# Patient Record
Sex: Male | Born: 1965 | Race: White | Hispanic: No | Marital: Married | Smoking: Never smoker
Health system: Southern US, Community
[De-identification: ages and names within clinical notes are randomized; demographics above are authoritative.]

## PROBLEM LIST (undated history)

## (undated) DIAGNOSIS — R7303 Prediabetes: Secondary | ICD-10-CM

## (undated) DIAGNOSIS — T7840XA Allergy, unspecified, initial encounter: Secondary | ICD-10-CM

## (undated) DIAGNOSIS — E119 Type 2 diabetes mellitus without complications: Secondary | ICD-10-CM

## (undated) DIAGNOSIS — E7800 Pure hypercholesterolemia, unspecified: Secondary | ICD-10-CM

## (undated) DIAGNOSIS — M1990 Unspecified osteoarthritis, unspecified site: Secondary | ICD-10-CM

## (undated) DIAGNOSIS — I10 Essential (primary) hypertension: Secondary | ICD-10-CM

## (undated) HISTORY — DX: Unspecified osteoarthritis, unspecified site: M19.90

## (undated) HISTORY — PX: KNEE ARTHROSCOPY: SUR90

## (undated) HISTORY — PX: ANKLE FRACTURE SURGERY: SHX122

## (undated) HISTORY — DX: Allergy, unspecified, initial encounter: T78.40XA

## (undated) HISTORY — DX: Type 2 diabetes mellitus without complications: E11.9

---

## 2006-04-18 ENCOUNTER — Emergency Department (HOSPITAL_COMMUNITY): Admission: EM | Admit: 2006-04-18 | Discharge: 2006-04-18 | Payer: Self-pay | Admitting: Emergency Medicine

## 2008-02-07 IMAGING — CR DG CHEST 2V
2 series · 2 of 2 positions shown · non-contrast
Comparison: None.

CLINICAL DATA: Chest pain/shortness of breath.  
 CHEST ? 2 VIEW:

[w chest pa]
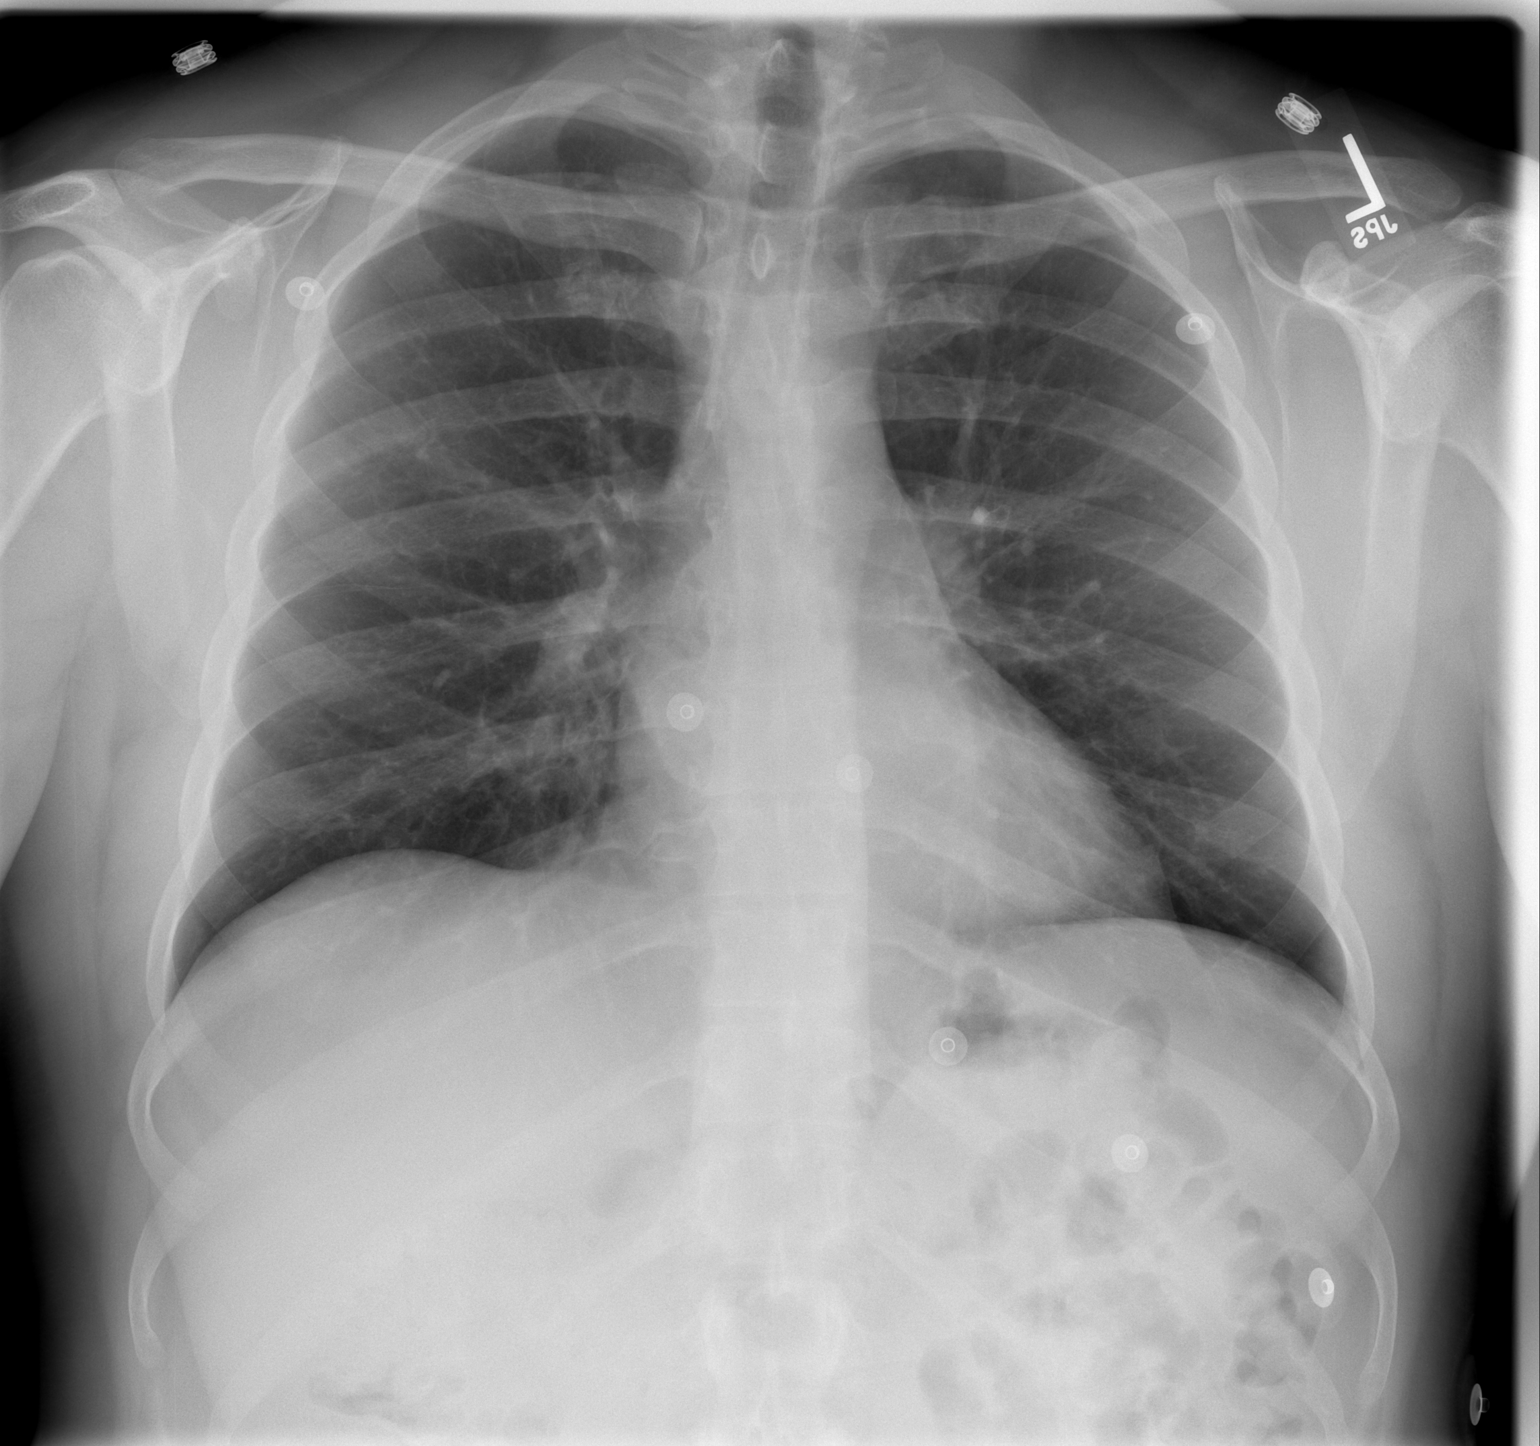

[w chest lat]
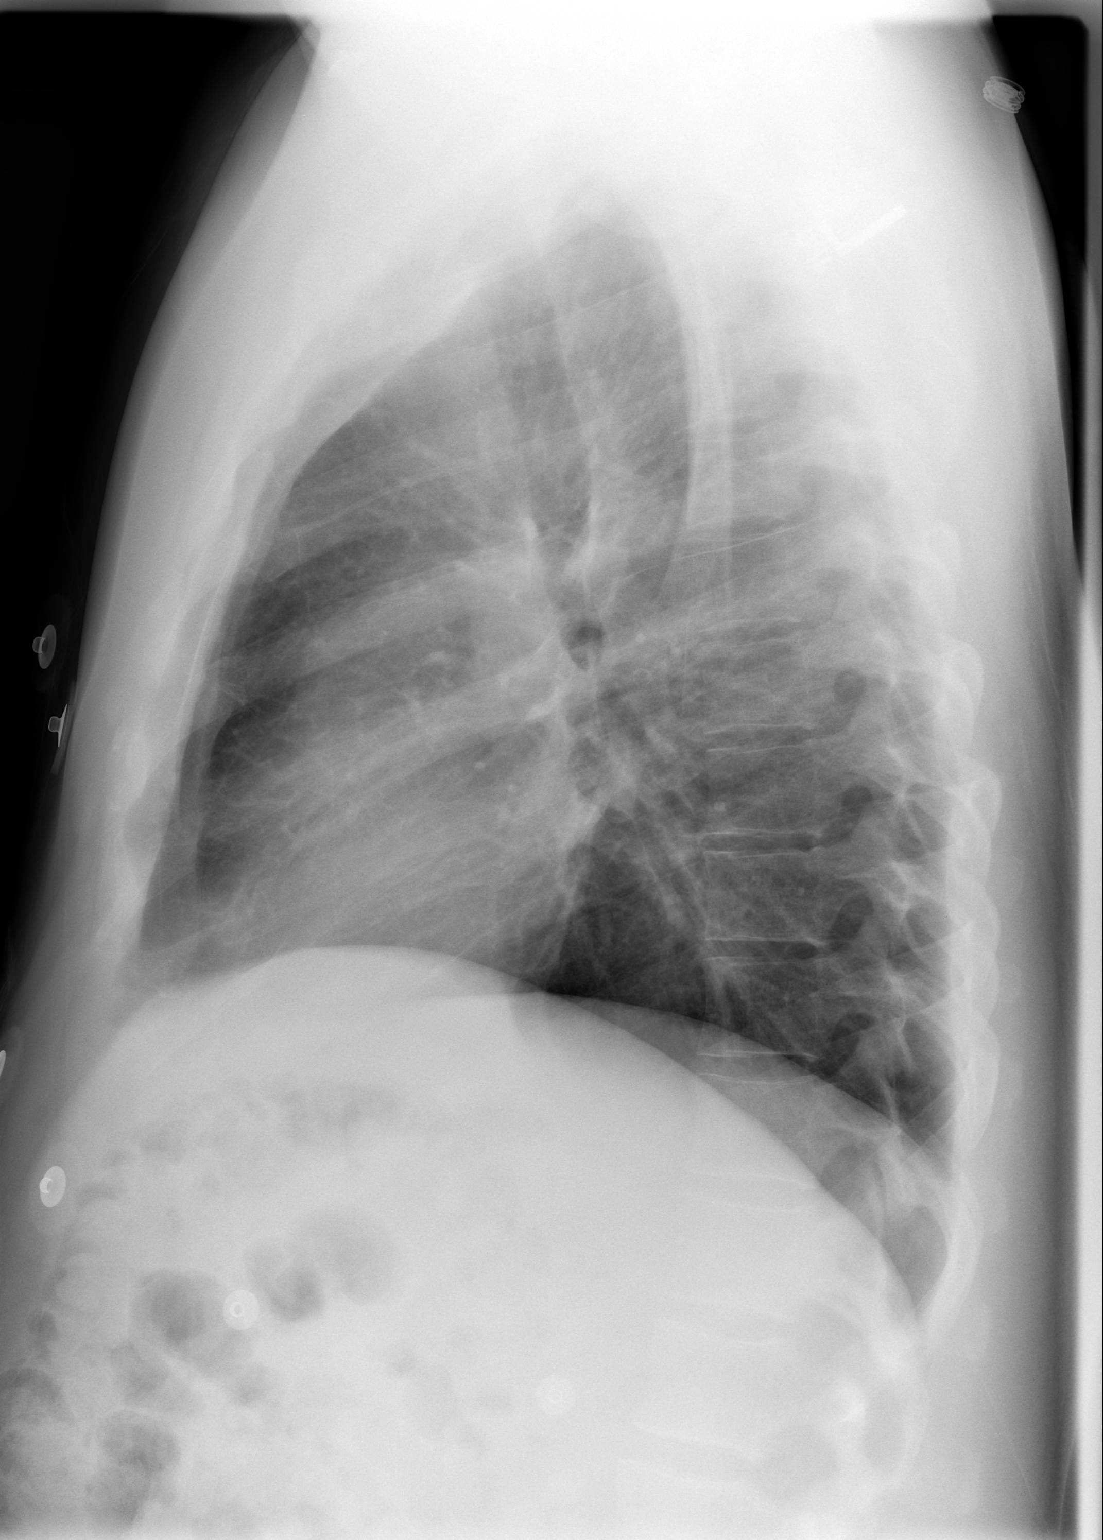

[2 of 2 positions shown; findings below may reference images not displayed]

FINDINGS: Heart and vascularity are normal.  Slight peribronchial thickening.  Lungs are clear.  Osseous structures are intact.
IMPRESSION: No active disease.

## 2008-08-18 ENCOUNTER — Ambulatory Visit: Payer: Self-pay | Admitting: Family Medicine

## 2008-08-18 DIAGNOSIS — B029 Zoster without complications: Secondary | ICD-10-CM | POA: Insufficient documentation

## 2018-12-08 DIAGNOSIS — S86919A Strain of unspecified muscle(s) and tendon(s) at lower leg level, unspecified leg, initial encounter: Secondary | ICD-10-CM | POA: Insufficient documentation

## 2020-03-22 ENCOUNTER — Other Ambulatory Visit: Payer: Self-pay

## 2020-03-22 ENCOUNTER — Ambulatory Visit
Admission: EM | Admit: 2020-03-22 | Discharge: 2020-03-22 | Disposition: A | Discharge status: Home / Self Care | Payer: Self-pay | Attending: Urgent Care | Admitting: Urgent Care

## 2020-03-22 DIAGNOSIS — L02414 Cutaneous abscess of left upper limb: Secondary | ICD-10-CM | POA: Diagnosis not present

## 2020-03-22 DIAGNOSIS — M79632 Pain in left forearm: Secondary | ICD-10-CM | POA: Diagnosis not present

## 2020-03-22 MED ORDER — DOXYCYCLINE HYCLATE 100 MG PO CAPS: 100.0000 mg | ORAL_CAPSULE | Freq: Two times a day (BID) | ORAL | 0 refills | Status: DC

## 2020-03-22 NOTE — Discharge Instructions (Signed)
Please change your dressing 3-5 times daily. Do not apply any ointments or creams. Each time you change your dressing, make sure that you are pressing on the wound to get pus to come out.  Try your best to have a family member help you clean gently around the perimeter of the wound with gentle soap and warm water. Pat your wound dry and let it air out if possible to make sure it is dry before reapplying another dressing.    Do not use any nonsteroidal anti-inflammatories (NSAIDs) like ibuprofen, Motrin, naproxen, Aleve, etc. which are all available over-the-counter.  Please just use Tylenol at a dose of 500mg-650mg once every 6 hours as needed for your aches, pains, fevers.  

## 2020-03-22 NOTE — ED Provider Notes (Signed)
  [facility]   MRN: [id number] DOB: [birth year]  Subjective:   Terry Hoffman is a 55 y.o. male presenting for 1 week history of persistent left forearm pain, swelling, redness.  Patient did have an open wound but has had a difficult time draining anything from it.  Has gotten worse in the past couple days.  Has been using Aleve consistently with very temporary relief.  Has a history of staph infections.  No current facility-administered medications for this encounter. No current outpatient medications on file.   No Known Allergies  History reviewed. No pertinent past medical history.   History reviewed. No pertinent surgical history.  History reviewed. No pertinent family history.  Social History   Tobacco Use  . Smoking status: Never Smoker  . Smokeless tobacco: Never Used  Substance Use Topics  . Alcohol use: Never  . Drug use: Never    ROS   Objective:   Vitals: BP (!) 180/96 (BP Location: Left Arm)   Pulse (!) 109   Temp 98.7 F (37.1 C) (Oral)   Resp 18   SpO2 96%   Physical Exam Constitutional:      General: He is not in acute distress.    Appearance: Normal appearance. He is well-developed and normal weight. He is not ill-appearing, toxic-appearing or diaphoretic.  HENT:     Head: Normocephalic and atraumatic.     Right Ear: External ear normal.     Left Ear: External ear normal.     Nose: Nose normal.     Mouth/Throat:     Pharynx: Oropharynx is clear.  Eyes:     General: No scleral icterus.       Right eye: No discharge.        Left eye: No discharge.     Extraocular Movements: Extraocular movements intact.     Pupils: Pupils are equal, round, and reactive to light.  Cardiovascular:     Rate and Rhythm: Normal rate.  Pulmonary:     Effort: Pulmonary effort is normal.  Musculoskeletal:       Arms:     Cervical back: Normal range of motion.  Neurological:     Mental Status: He is alert and oriented to person, place, and time.   Psychiatric:        Mood and Affect: Mood normal.        Behavior: Behavior normal.        Thought Content: Thought content normal.        Judgment: Judgment normal.     PROCEDURE NOTE: I&D of Abscess Verbal consent obtained. Local anesthesia with 1cc of 2% lidocaine with epinephrine. Site cleansed with Betadine.  Eschar was removed and ~1cc of purulence expressed. Cleansed and dressed.   Assessment and Plan :   PDMP not reviewed this encounter.  1. Pain of left forearm   2. Abscess of forearm, left     Successful I&D performed.  Wound care reviewed.  Start doxycycline for the abscess, Tylenol for pain and his blood pressure, recommended avoiding NSAIDs for now.  Counseled patient on potential for adverse effects with medications prescribed/recommended today, ER and return-to-clinic precautions discussed, patient verbalized understanding.    Wallis Bamberg, PA-C 03/22/20 1318

## 2020-03-22 NOTE — ED Triage Notes (Signed)
Pt c/o abscess to lt lateral forearm x1 wk. States hx of staph infection. Redness with swelling noted, denies drainage.

## 2021-05-01 ENCOUNTER — Ambulatory Visit
Admission: EM | Admit: 2021-05-01 | Discharge: 2021-05-01 | Disposition: A | Discharge status: Home / Self Care | Payer: BC Managed Care – PPO

## 2021-05-01 DIAGNOSIS — L0291 Cutaneous abscess, unspecified: Secondary | ICD-10-CM | POA: Diagnosis not present

## 2021-05-01 MED ORDER — DOXYCYCLINE HYCLATE 100 MG PO CAPS: 100.0000 mg | ORAL_CAPSULE | Freq: Two times a day (BID) | ORAL | 0 refills | Status: DC

## 2021-05-01 NOTE — ED Triage Notes (Signed)
3 day h/o abscess on right axilla. Area has increased in size and redness since the onset. Pt reports he was seen at another UC, who thought the area was an ingrown hair and tx Pt with bactrim. No decrease in sxs since starting the meds. Pt is concerned for staph infection. ?

## 2021-05-01 NOTE — ED Provider Notes (Signed)
?[facility] ? ? ? ?CSN: [id number] ?Arrival date & time: 05/01/21  0935 ? ? ?  ? ?History   ?Chief Complaint ?Chief Complaint  ?Patient presents with  ? Abscess  ?  axilla  ? ? ?HPI ?Terry Hoffman is a 56 y.o. male.  ? ?Patient here today for evaluation of abscess to his right axilla. He first noted irritation to the area 3 days ago. He has since been evaluated and treated with bactrim but erythema and pain seems to be increasing in size. He does report low grade fever yesterday. He has history of staph infections.  ? ?The history is provided by the patient.  ? ?History reviewed. No pertinent past medical history. ? ?Patient Active Problem List  ? Diagnosis Date Noted  ? HERPES ZOSTER WITHOUT MENTION OF COMPLICATION 08/18/2008  ? ? ?History reviewed. No pertinent surgical history. ? ? ? ? ?Home Medications   ? ?Prior to Admission medications   ?Medication Sig Start Date End Date Taking? Authorizing Provider  ?doxycycline (VIBRAMYCIN) 100 MG capsule Take 1 capsule (100 mg total) by mouth 2 (two) times daily. 05/01/21  Yes Tomi Bamberger, PA-C  ?albuterol (VENTOLIN HFA) 108 (90 Base) MCG/ACT inhaler Inhale 2 puffs into the lungs every 6 (six) hours as needed. 01/10/21   [provider]  ?sulfamethoxazole-trimethoprim (BACTRIM DS) 800-160 MG tablet Take 1 tablet by mouth 2 (two) times daily. 04/29/21   [provider]  ? ? ?Family History ?History reviewed. No pertinent family history. ? ?Social History ?Social History  ? ?Tobacco Use  ? Smoking status: Never  ? Smokeless tobacco: Never  ?Substance Use Topics  ? Alcohol use: Never  ? Drug use: Never  ? ? ? ?Allergies   ?Patient has no known allergies. ? ? ?Review of Systems ?Review of Systems  ?Constitutional:  Negative for chills and fever.  ?Eyes:  Negative for discharge and redness.  ?Skin:  Positive for color change. Negative for wound.  ?Neurological:  Negative for numbness.  ? ? ?Physical Exam ?Triage Vital Signs ?ED Triage Vitals   ?Enc Vitals Group  ?   BP   ?   Pulse   ?   Resp   ?   Temp   ?   Temp src   ?   SpO2   ?   Weight   ?   Height   ?   Head Circumference   ?   Peak Flow   ?   Pain Score   ?   Pain Loc   ?   Pain Edu?   ?   Excl. in GC?   ? ?No data found. ? ?Updated Vital Signs ?BP (!) 170/121 (BP Location: Left Arm)   Pulse (!) 108   Temp 98.5 ?F (36.9 ?C) (Oral)   Resp 17   SpO2 97%  ?   ? ?Physical Exam ?Vitals and nursing note reviewed.  ?Constitutional:   ?   General: He is not in acute distress. ?   Appearance: Normal appearance. He is not ill-appearing.  ?HENT:  ?   Head: Normocephalic and atraumatic.  ?Eyes:  ?   Conjunctiva/sclera: Conjunctivae normal.  ?Cardiovascular:  ?   Rate and Rhythm: Normal rate.  ?Pulmonary:  ?   Effort: Pulmonary effort is normal.  ?Skin: ?   Comments: Approx 10 cm x 17 cm area of erythema to right torso below axilla, mild induration noted, no fluctuance noted  ?Neurological:  ?   Mental Status:  He is alert.  ?Psychiatric:     ?   Mood and Affect: Mood normal.     ?   Behavior: Behavior normal.     ?   Thought Content: Thought content normal.  ? ? ? ?UC Treatments / Results  ?Labs ?(all labs ordered are listed, but only abnormal results are displayed) ?Labs Reviewed - No data to display ? ?EKG ? ? ?Radiology ?No results found. ? ?Procedures ?Procedures (including critical care time) ? ?Medications Ordered in UC ?Medications - No data to display ? ?Initial Impression / Assessment and Plan / UC Course  ?I have reviewed the triage vital signs and the nursing notes. ? ?Pertinent labs & imaging results that were available during my care of the patient were reviewed by me and considered in my medical decision making (see chart for details). ? ?  ?Concerning that infection continues to spread despite use of bactrim-- will add doxycycline to oral regimen with very strict precaution to report to ED with any worsening or lack of improvement in 24 hours. I did use permanent marker to outline border of  erythema and advised patient to monitor for any spread outside of my marking. Patient expresses understanding.  ? ?Final Clinical Impressions(s) / UC Diagnoses  ? ?Final diagnoses:  ?Abscess  ? ? ? ?Discharge Instructions   ? ?  ? ?Report to ED with any spread of erythema or worsening symptoms.  ? ? ? ? ?ED Prescriptions   ? ? Medication Sig Dispense Auth. Provider  ? doxycycline (VIBRAMYCIN) 100 MG capsule Take 1 capsule (100 mg total) by mouth 2 (two) times daily. 20 capsule Tomi Bamberger, PA-C  ? ?  ? ?PDMP not reviewed this encounter. ?  ?Tomi Bamberger, PA-C ?05/01/21 1230 ? ?

## 2021-05-01 NOTE — Discharge Instructions (Addendum)
?  Report to ED with any spread of erythema or worsening symptoms.  ?

## 2021-07-28 ENCOUNTER — Emergency Department (HOSPITAL_COMMUNITY): Payer: BC Managed Care – PPO

## 2021-07-28 ENCOUNTER — Other Ambulatory Visit: Payer: Self-pay

## 2021-07-28 ENCOUNTER — Encounter (HOSPITAL_COMMUNITY): Payer: Self-pay

## 2021-07-28 ENCOUNTER — Emergency Department (HOSPITAL_COMMUNITY)
Admission: EM | Admit: 2021-07-28 | Discharge: 2021-07-28 | Discharge status: Against Medical Advice | Payer: BC Managed Care – PPO | Attending: Emergency Medicine | Admitting: Emergency Medicine

## 2021-07-28 DIAGNOSIS — Z5321 Procedure and treatment not carried out due to patient leaving prior to being seen by health care provider: Secondary | ICD-10-CM | POA: Diagnosis not present

## 2021-07-28 DIAGNOSIS — R079 Chest pain, unspecified: Secondary | ICD-10-CM | POA: Insufficient documentation

## 2021-07-28 DIAGNOSIS — R42 Dizziness and giddiness: Secondary | ICD-10-CM | POA: Insufficient documentation

## 2021-07-28 DIAGNOSIS — I10 Essential (primary) hypertension: Secondary | ICD-10-CM | POA: Diagnosis not present

## 2021-07-28 DIAGNOSIS — R002 Palpitations: Secondary | ICD-10-CM | POA: Insufficient documentation

## 2021-07-28 DIAGNOSIS — Y990 Civilian activity done for income or pay: Secondary | ICD-10-CM | POA: Insufficient documentation

## 2021-07-28 LAB — CBC
HCT: 47.2 % (ref 39.0–52.0)
Hemoglobin: 15.9 g/dL (ref 13.0–17.0)
MCH: 30.5 pg (ref 26.0–34.0)
MCHC: 33.7 g/dL (ref 30.0–36.0)
MCV: 90.4 fL (ref 80.0–100.0)
Platelets: 243 10*3/uL (ref 150–400)
RBC: 5.22 MIL/uL (ref 4.22–5.81)
RDW: 12.2 % (ref 11.5–15.5)
WBC: 10.1 10*3/uL (ref 4.0–10.5)
nRBC: 0 % (ref 0.0–0.2)

## 2021-07-28 LAB — BASIC METABOLIC PANEL
Anion gap: 11 (ref 5–15)
BUN: 14 mg/dL (ref 6–20)
CO2: 23 mmol/L (ref 22–32)
Calcium: 9 mg/dL (ref 8.9–10.3)
Chloride: 102 mmol/L (ref 98–111)
Creatinine, Ser: 0.94 mg/dL (ref 0.61–1.24)
GFR, Estimated: >60 mL/min (ref >60)
Glucose, Bld: 184 mg/dL — ABNORMAL HIGH (ref 70–99)
Potassium: 3.9 mmol/L (ref 3.5–5.1)
Sodium: 136 mmol/L (ref 135–145)

## 2021-07-28 LAB — TROPONIN I (HIGH SENSITIVITY)
Troponin I (High Sensitivity): 30 ng/L — ABNORMAL HIGH (ref <18)
Troponin I (High Sensitivity): 37 ng/L — ABNORMAL HIGH (ref <18)

## 2021-07-28 MED ORDER — ASPIRIN 81 MG PO CHEW: 324.0000 mg | CHEWABLE_TABLET | Freq: Once | ORAL | Status: DC

## 2021-07-28 NOTE — ED Notes (Signed)
Patient upset over wait time and states that he no longer wants to be seen. This NT tried to update vitals before he left and he said "I dont have time for that shit."

## 2021-07-28 NOTE — ED Triage Notes (Signed)
Pt came in via POV d/t CP & HTN that he has been having intermittently for the last month. When his chest does hurt he rates in 5 or 8/10 He endorses this happening when he gets hot & tired, then he become dizzy feels some palpitations at times & becomes SOB. A/Ox4.

## 2021-07-28 NOTE — ED Provider Triage Note (Signed)
Emergency Medicine Provider Triage Evaluation Note  Terry Hoffman , a 56 y.o. male  was evaluated in triage.  Pt complains of left-sided chest pain which she describes as cramping and tight.  This has been intermittent and associated with work and exertion for about a month or 2.  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  He had about 45 minutes of left-sided chest pain.  He states that he feels like if he could belch he would feel relief.  Patient takes red yeast rice for elevated triglycerides.  He does have a family history of heart disease.  He does not follow regularly with doctors and is unsure if he has diabetes or hypertension..  Review of Systems  Positive: Chest discomfort Negative: Palpitation  Physical Exam  BP (!) 175/122   Pulse (!) 102   Temp 98.3 F (36.8 C)   Resp 20   SpO2 97%  Gen:   Awake, no distress   Resp:  Normal effort  MSK:   Moves extremities without difficulty  Other:  No chest wall tenderness  Medical Decision Making  Medically screening exam initiated at 4:32 PM.  Appropriate orders placed.  Terry Hoffman was informed that the remainder of the evaluation will be completed by another provider, this initial triage assessment does not replace that evaluation, and the importance of remaining in the ED until their evaluation is complete.  Orders placed   Arthor Captain, PA-C 07/28/21 1634

## 2021-07-30 DIAGNOSIS — I160 Hypertensive urgency: Secondary | ICD-10-CM | POA: Insufficient documentation

## 2021-07-30 DIAGNOSIS — R079 Chest pain, unspecified: Secondary | ICD-10-CM | POA: Insufficient documentation

## 2022-02-22 DIAGNOSIS — M25571 Pain in right ankle and joints of right foot: Secondary | ICD-10-CM | POA: Insufficient documentation

## 2022-02-22 DIAGNOSIS — S82409A Unspecified fracture of shaft of unspecified fibula, initial encounter for closed fracture: Secondary | ICD-10-CM | POA: Insufficient documentation

## 2022-02-22 DIAGNOSIS — M25561 Pain in right knee: Secondary | ICD-10-CM | POA: Insufficient documentation

## 2022-02-22 DIAGNOSIS — M79604 Pain in right leg: Secondary | ICD-10-CM | POA: Insufficient documentation

## 2023-02-22 ENCOUNTER — Ambulatory Visit
Admission: EM | Admit: 2023-02-22 | Discharge: 2023-02-22 | Disposition: A | Discharge status: Home / Self Care | Payer: BC Managed Care – PPO | Attending: Internal Medicine | Admitting: Internal Medicine

## 2023-02-22 DIAGNOSIS — R051 Acute cough: Secondary | ICD-10-CM

## 2023-02-22 LAB — SARS CORONAVIRUS 2 BY RT PCR: SARS Coronavirus 2 by RT PCR: NEGATIVE

## 2023-02-22 MED ORDER — BENZONATATE 200 MG PO CAPS: 200.0000 mg | ORAL_CAPSULE | Freq: Three times a day (TID) | ORAL | 0 refills | Status: DC | PRN

## 2023-02-22 MED ORDER — HYDROCOD POLI-CHLORPHE POLI ER 10-8 MG/5ML PO SUER: 5.0000 mL | Freq: Two times a day (BID) | ORAL | 0 refills | Status: DC

## 2023-02-22 NOTE — ED Triage Notes (Signed)
"  This started about 1130/12MN last night, felt like a hair in my throat, this progressively is worse and now continuous cough with it pulling in my chest". No fever known. No new/unexplained rash". Some runny nose "now". No vaccines in past.

## 2023-02-22 NOTE — Discharge Instructions (Signed)
Start allegra to help drainage  In you get worse in 48 hours, then go get the chest xray at [facility] bridge on [address], in [place].   We will inform you when the Covid test comes back

## 2023-02-22 NOTE — ED Provider Notes (Signed)
[facility]    CSN: [id number] Arrival date & time: 02/22/23  1712      History   Chief Complaint Chief Complaint  Patient presents with   Cough    HPI Terry Hoffman is a 58 y.o. male who presents due to waking up from a nap last night feeling he had something in his throat that provoked him to cough, so he got up and ate something which seemed to help and slept fine. Then as soon as he got up this am, the cough came back and has been non stop off and on. The cough is not productive. He feels like he has drainage. He has been fatigued and has had decreased appetite. Denies sweating or having SOB.     History reviewed. No pertinent past medical history.  Patient Active Problem List   Diagnosis Date Noted   Closed fracture of shaft of fibula 02/22/2022   Right knee pain 02/22/2022   Pain in joint of right ankle 02/22/2022   Lower limb pain, inferior, right 02/22/2022   Hypertensive urgency 07/30/2021   Chest pain 07/30/2021   Strain of knee 12/08/2018   Herpes zoster 08/18/2008    History reviewed. No pertinent surgical history.     Home Medications    Prior to Admission medications   Medication Sig Start Date End Date Taking? Authorizing Provider  aspirin EC 81 MG tablet Take 1 tablet by mouth 2 (two) times daily. 02/27/22   [provider]  atorvastatin (LIPITOR) 40 MG tablet Take 40 mg by mouth daily. 07/31/21   [provider]  chlorpheniramine-HYDROcodone (TUSSIONEX) 10-8 MG/5ML Take 5 mLs by mouth 2 (two) times daily. 02/22/23  Yes Rodriguez-Southworth, Nettie Elm, PA-C  diclofenac Sodium (VOLTAREN) 1 % GEL Apply 2 g topically 4 (four) times daily. 07/10/22   [provider]  docusate sodium (COLACE) 100 MG capsule Take 100 mg by mouth daily. 02/27/22   [provider]  Menthol (RICOLA MT) Use as directed in the mouth or throat.   Yes [provider]  metFORMIN (GLUCOPHAGE-XR) 750 MG 24 hr tablet Take 750 mg by mouth  daily with breakfast. 08/01/21   [provider]  metoprolol tartrate (LOPRESSOR) 50 MG tablet Take 50 mg by mouth 2 (two) times daily. 07/31/21   [provider]  ondansetron (ZOFRAN) 4 MG tablet Take 4 mg by mouth every 8 (eight) hours as needed. 02/27/22   [provider]  oxyCODONE (OXY IR/ROXICODONE) 5 MG immediate release tablet Take 5 mg by mouth every 6 (six) hours as needed. 02/27/22   [provider]  albuterol (VENTOLIN HFA) 108 (90 Base) MCG/ACT inhaler Inhale 2 puffs into the lungs every 6 (six) hours as needed. 01/10/21   [provider]  amLODipine (NORVASC) 5 MG tablet Take 5 mg by mouth daily.    [provider]  cyclobenzaprine (FLEXERIL) 10 MG tablet Take 10 mg by mouth at bedtime.    [provider]  doxycycline (VIBRAMYCIN) 100 MG capsule Take 1 capsule (100 mg total) by mouth 2 (two) times daily. 05/01/21   Tomi Bamberger, PA-C  doxycycline (VIBRAMYCIN) 100 MG capsule Take 1 capsule by mouth 2 (two) times daily.    [provider]  glipiZIDE (GLUCOTROL) 5 MG tablet Take 1 tablet by mouth 2 (two) times daily.    [provider]  lisinopril (ZESTRIL) 10 MG tablet Take 10 mg by mouth daily.    [provider]  meloxicam (MOBIC) 15 MG  tablet Take 1 tablet by mouth daily.    [provider]  meloxicam (MOBIC) 7.5 MG tablet Take 1 tablet by mouth daily as needed.    [provider]  predniSONE (DELTASONE) 10 MG tablet Take 10 mg by mouth daily with breakfast.    [provider]  sulfamethoxazole-trimethoprim (BACTRIM DS) 800-160 MG tablet Take 1 tablet by mouth 2 (two) times daily. 04/29/21   [provider]  sulfamethoxazole-trimethoprim (BACTRIM DS) 800-160 MG tablet Take 1 tablet by mouth every 12 (twelve) hours.    [provider]    Family History History reviewed. No pertinent family history.  Social History Social History   Tobacco Use    Smoking status: Never   Smokeless tobacco: Current    Types: Snuff  Vaping Use   Vaping status: Never Used  Substance Use Topics   Alcohol use: Yes    Comment: 6 a year.   Drug use: Never     Allergies   Metformin   Review of Systems Review of Systems As noted in HPI  Physical Exam Triage Vital Signs ED Triage Vitals  Encounter Vitals Group     BP 02/22/23 1802 (!) 147/69     Systolic BP Percentile --      Diastolic BP Percentile --      Pulse Rate 02/22/23 1802 94     Resp 02/22/23 1802 20     Temp 02/22/23 1802 98.6 F (37 C)     Temp Source 02/22/23 1802 Oral     SpO2 02/22/23 1802 98 %     Weight 02/22/23 1759 230 lb (104.3 kg)     Height 02/22/23 1759 5' 8.5" (1.74 m)     Head Circumference --      Peak Flow --      Pain Score 02/22/23 1758 0     Pain Loc --      Pain Education --      Exclude from Growth Chart --    No data found.  Updated Vital Signs BP (!) 147/69 (BP Location: Left Arm)   Pulse 94   Temp 98.6 F (37 C) (Oral)   Resp 20   Ht 5' 8.5" (1.74 m)   Wt 230 lb (104.3 kg)   SpO2 98%   BMI 34.46 kg/m   Visual Acuity Right Eye Distance:   Left Eye Distance:   Bilateral Distance:    Right Eye Near:   Left Eye Near:    Bilateral Near:     Physical Exam  Physical Exam Vitals signs and nursing note reviewed.  Constitutional:      General: he is not in acute distress.    Appearance: Normal appearance. He is not ill-appearing, toxic-appearing or diaphoretic.  HENT:     Head: Normocephalic.     Right Ear: Tympanic membrane, ear canal and external ear normal.     Left Ear: Tympanic membrane, ear canal and external ear normal.     Nose: Nose normal.     Mouth/Throat: clear, uvula is normal.     Mouth: Mucous membranes are moist.  Eyes:     General: No scleral icterus.       Right eye: No discharge.        Left eye: No discharge.     Conjunctiva/sclera: Conjunctivae normal.  Neck:     Musculoskeletal: Neck supple. No neck  rigidity.  Cardiovascular:     Rate and Rhythm: Normal rate and regular rhythm.  Heart sounds: No murmur.  Pulmonary:     Effort: Pulmonary effort is normal.     Breath sounds: Normal breath sounds.   Musculoskeletal: Normal range of motion.  Lymphadenopathy:     Cervical: No cervical adenopathy.  Skin:    General: Skin is warm and dry.     Coloration: Skin is not jaundiced.     Findings: No rash.  Neurological:     Mental Status: She is alert and oriented to person, place, and time.     Gait: Gait normal.  Psychiatric:        Mood and Affect: Mood normal.        Behavior: Behavior normal.        Thought Content: Thought content normal.        Judgment: Judgment normal.   UC Treatments / Results  Labs (all labs ordered are listed, but only abnormal results are displayed) Labs Reviewed  SARS CORONAVIRUS 2 BY RT PCR    EKG   Radiology No results found.  Procedures Procedures (including critical care time)  Medications Ordered in UC Medications - No data to display  Initial Impression / Assessment and Plan / UC Course  I have reviewed the triage vital signs and the nursing notes.  Acute cough  Advised to start on his Allegra and I placed him on Tessalon but his pharmacy would not cover it, so I changed it to Tussionex. If not improving in 48 hr or gets worse, I ordered a chest xray to r/o aspiration pneumonia though his lung exam is normal and has not had a fever.      Final Clinical Impressions(s) / UC Diagnoses   Final diagnoses:  Acute cough     Discharge Instructions      Start allegra to help drainage  In you get worse in 48 hours, then go get the chest xray at [facility] on [address], in [place].   We will inform you when the Covid test comes back      ED Prescriptions     Medication Sig Dispense Auth. Provider   benzonatate (TESSALON) 200 MG capsule  (Status: Discontinued) Take 1 capsule (200 mg total) by  mouth 3 (three) times daily as needed for cough. 30 capsule Rodriguez-Southworth, Raybon Conard, PA-C   chlorpheniramine-HYDROcodone (TUSSIONEX) 10-8 MG/5ML Take 5 mLs by mouth 2 (two) times daily. 115 mL Rodriguez-Southworth, Nettie Elm, PA-C      I have reviewed the PDMP during this encounter.   Garey Ham, [place] 02/22/23 [phone]

## 2023-02-25 ENCOUNTER — Ambulatory Visit
Admission: EM | Admit: 2023-02-25 | Discharge: 2023-02-25 | Disposition: A | Discharge status: Home / Self Care | Payer: BC Managed Care – PPO

## 2023-02-25 DIAGNOSIS — R051 Acute cough: Secondary | ICD-10-CM | POA: Diagnosis not present

## 2023-02-25 DIAGNOSIS — J069 Acute upper respiratory infection, unspecified: Secondary | ICD-10-CM | POA: Diagnosis not present

## 2023-02-25 HISTORY — DX: Essential (primary) hypertension: I10

## 2023-02-25 HISTORY — DX: Prediabetes: R73.03

## 2023-02-25 HISTORY — DX: Pure hypercholesterolemia, unspecified: E78.00

## 2023-02-25 NOTE — ED Triage Notes (Signed)
Pt presents with ongoing cough, initially started 4 days ago. Reports fever starting two days ago that resolved this morning. Taking Tylenol/Ibuprofen.  Tessalon Perles were working for cough. Feels like rattling in chest is worse but is not coughing worse.

## 2023-02-25 NOTE — Discharge Instructions (Addendum)
 Return if any problems.

## 2023-02-27 NOTE — ED Provider Notes (Signed)
[facility]    CSN: [id number] Arrival date & time: 02/25/23  1024      History   Chief Complaint Chief Complaint  Patient presents with   Cough    HPI Terry Hoffman is a 58 y.o. male.   Patient complains of a cough and congestion.  Patient reports he has been taking Lawyer which have been helping with the cough but it seems to be getting worse.  Patient denies any shortness of breath.  He denies any difficulty breathing.   Cough   Past Medical History:  Diagnosis Date   Hypercholesteremia    Hypertension    Pre-diabetes     Patient Active Problem List   Diagnosis Date Noted   Closed fracture of shaft of fibula 02/22/2022   Right knee pain 02/22/2022   Pain in joint of right ankle 02/22/2022   Lower limb pain, inferior, right 02/22/2022   Hypertensive urgency 07/30/2021   Chest pain 07/30/2021   Strain of knee 12/08/2018   Herpes zoster 08/18/2008    History reviewed. No pertinent surgical history.     Home Medications    Prior to Admission medications   Medication Sig Start Date End Date Taking? Authorizing Provider  albuterol (VENTOLIN HFA) 108 (90 Base) MCG/ACT inhaler Inhale 2 puffs into the lungs every 6 (six) hours as needed. 01/10/21   [provider]  amLODipine (NORVASC) 5 MG tablet Take 5 mg by mouth daily.    [provider]  aspirin EC 81 MG tablet Take 1 tablet by mouth 2 (two) times daily. 02/27/22   [provider]  atorvastatin (LIPITOR) 40 MG tablet Take 40 mg by mouth daily. 07/31/21   [provider]  chlorpheniramine-HYDROcodone (TUSSIONEX) 10-8 MG/5ML Take 5 mLs by mouth 2 (two) times daily. 02/22/23   Rodriguez-Southworth, Nettie Elm, PA-C  cyclobenzaprine (FLEXERIL) 10 MG tablet Take 10 mg by mouth at bedtime.    [provider]  diclofenac Sodium (VOLTAREN) 1 % GEL Apply 2 g topically 4 (four) times daily. 07/10/22   [provider]  docusate sodium (COLACE) 100 MG  capsule Take 100 mg by mouth daily. 02/27/22   [provider]  doxycycline (VIBRAMYCIN) 100 MG capsule Take 1 capsule (100 mg total) by mouth 2 (two) times daily. 05/01/21   Tomi Bamberger, PA-C  doxycycline (VIBRAMYCIN) 100 MG capsule Take 1 capsule by mouth 2 (two) times daily.    [provider]  glipiZIDE (GLUCOTROL) 5 MG tablet Take 1 tablet by mouth 2 (two) times daily.    [provider]  lisinopril (ZESTRIL) 10 MG tablet Take 10 mg by mouth daily.    [provider]  meloxicam (MOBIC) 15 MG tablet Take 1 tablet by mouth daily.    [provider]  meloxicam (MOBIC) 7.5 MG tablet Take 1 tablet by mouth daily as needed.    [provider]  Menthol (RICOLA MT) Use as directed in the mouth or throat.    [provider]  metoprolol tartrate (LOPRESSOR) 50 MG tablet Take 50 mg by mouth 2 (two) times daily. 07/31/21   [provider]  ondansetron (ZOFRAN) 4 MG tablet Take 4 mg by mouth every 8 (eight) hours as needed. 02/27/22   [provider]  oxyCODONE (OXY IR/ROXICODONE) 5 MG immediate release tablet Take 5 mg by mouth every 6 (six) hours as needed. 02/27/22   [provider]  predniSONE (DELTASONE) 10 MG tablet Take 10 mg by mouth daily with  breakfast.    [provider]    Family History History reviewed. No pertinent family history.  Social History Social History   Tobacco Use   Smoking status: Never   Smokeless tobacco: Current    Types: Snuff  Vaping Use   Vaping status: Never Used  Substance Use Topics   Alcohol use: Yes    Comment: 6 a year.   Drug use: Never     Allergies   Metformin   Review of Systems Review of Systems  Respiratory:  Positive for cough.   All other systems reviewed and are negative.    Physical Exam Triage Vital Signs ED Triage Vitals  Encounter Vitals Group     BP 02/25/23 1521 123/84     Systolic BP Percentile --      Diastolic BP Percentile  --      Pulse Rate 02/25/23 1521 84     Resp 02/25/23 1521 18     Temp 02/25/23 1521 98.8 F (37.1 C)     Temp Source 02/25/23 1521 Oral     SpO2 02/25/23 1521 95 %     Weight --      Height --      Head Circumference --      Peak Flow --      Pain Score 02/25/23 1516 0     Pain Loc --      Pain Education --      Exclude from Growth Chart --    No data found.  Updated Vital Signs BP 123/84 (BP Location: Left Arm)   Pulse 84   Temp 98.8 F (37.1 C) (Oral)   Resp 18   SpO2 95%   Visual Acuity Right Eye Distance:   Left Eye Distance:   Bilateral Distance:    Right Eye Near:   Left Eye Near:    Bilateral Near:     Physical Exam Vitals and nursing note reviewed.  Constitutional:      General: He is not in acute distress.    Appearance: He is well-developed.  HENT:     Head: Normocephalic and atraumatic.  Eyes:     Conjunctiva/sclera: Conjunctivae normal.  Cardiovascular:     Rate and Rhythm: Normal rate and regular rhythm.     Heart sounds: No murmur heard. Pulmonary:     Effort: Pulmonary effort is normal. No respiratory distress.     Breath sounds: Normal breath sounds.  Abdominal:     Palpations: Abdomen is soft.     Tenderness: There is no abdominal tenderness.  Musculoskeletal:        General: No swelling.     Cervical back: Neck supple.  Skin:    General: Skin is warm and dry.     Capillary Refill: Capillary refill takes less than 2 seconds.  Neurological:     Mental Status: He is alert.  Psychiatric:        Mood and Affect: Mood normal.      UC Treatments / Results  Labs (all labs ordered are listed, but only abnormal results are displayed) Labs Reviewed - No data to display  EKG   Radiology No results found.  Procedures Procedures (including critical care time)  Medications Ordered in UC Medications - No data to display  Initial Impression / Assessment and Plan / UC Course  I have reviewed the triage vital signs and the nursing  notes.  Pertinent labs & imaging results that were available during my care of the  patient were reviewed by me and considered in my medical decision making (see chart for details).    Patient lungs sound good no signs or symptoms of pneumonia.  Patient is advised to continue Tessalon return for recheck if symptoms persist.  Final Clinical Impressions(s) / UC Diagnoses   Final diagnoses:  Acute cough  Acute upper respiratory infection     Discharge Instructions      Return if any problems.      ED Prescriptions   None    PDMP not reviewed this encounter. An After Visit Summary was printed and given to the patient.       Elson Areas, [place] 02/27/23 1556

## 2023-05-20 ENCOUNTER — Encounter: Payer: Self-pay | Admitting: Gastroenterology

## 2023-07-03 ENCOUNTER — Other Ambulatory Visit: Payer: Self-pay

## 2023-07-03 ENCOUNTER — Ambulatory Visit (AMBULATORY_SURGERY_CENTER)

## 2023-07-03 VITALS — Ht 68.5 in | Wt 230.0 lb

## 2023-07-03 DIAGNOSIS — Z1211 Encounter for screening for malignant neoplasm of colon: Secondary | ICD-10-CM

## 2023-07-03 MED ORDER — NA SULFATE-K SULFATE-MG SULF 17.5-3.13-1.6 GM/177ML PO SOLN: 1.0000 | Freq: Once | ORAL | 0 refills | Status: AC

## 2023-07-03 NOTE — Progress Notes (Signed)
 Denies allergies to eggs or soy products. Denies complication of anesthesia or sedation. Denies use of weight loss medication. Denies use of O2.   Emmi instructions given for colonoscopy.

## 2023-07-04 ENCOUNTER — Encounter: Payer: Self-pay | Admitting: Gastroenterology

## 2023-07-24 ENCOUNTER — Encounter: Payer: Self-pay | Admitting: Gastroenterology

## 2023-07-24 ENCOUNTER — Ambulatory Visit: Admitting: Gastroenterology

## 2023-07-24 VITALS — BP 154/86 | HR 70 | Temp 97.3°F | Resp 14 | Ht 68.5 in | Wt 230.0 lb

## 2023-07-24 DIAGNOSIS — K640 First degree hemorrhoids: Secondary | ICD-10-CM | POA: Diagnosis not present

## 2023-07-24 DIAGNOSIS — D122 Benign neoplasm of ascending colon: Secondary | ICD-10-CM | POA: Diagnosis not present

## 2023-07-24 DIAGNOSIS — K635 Polyp of colon: Secondary | ICD-10-CM

## 2023-07-24 DIAGNOSIS — D120 Benign neoplasm of cecum: Secondary | ICD-10-CM | POA: Diagnosis not present

## 2023-07-24 DIAGNOSIS — D127 Benign neoplasm of rectosigmoid junction: Secondary | ICD-10-CM

## 2023-07-24 DIAGNOSIS — Z1211 Encounter for screening for malignant neoplasm of colon: Secondary | ICD-10-CM

## 2023-07-24 DIAGNOSIS — K5730 Diverticulosis of large intestine without perforation or abscess without bleeding: Secondary | ICD-10-CM

## 2023-07-24 MED ORDER — SODIUM CHLORIDE 0.9 % IV SOLN: 500.0000 mL | Freq: Once | INTRAVENOUS | Status: DC

## 2023-07-24 NOTE — Progress Notes (Signed)
 Pt's states no medical or surgical changes since previsit or office visit.

## 2023-07-24 NOTE — Progress Notes (Signed)
 [facility] History and Physical   Primary Care Physician:  Sabas Norleen PARAS., MD   Reason for Procedure:   CRC screening  Plan:    colon     HPI: Terry Hoffman is a 58 y.o. male    Past Medical History:  Diagnosis Date   Allergy    Arthritis    Diabetes mellitus without complication (HCC)    Hypercholesteremia    Hypertension    Pre-diabetes     Past Surgical History:  Procedure Laterality Date   ANKLE FRACTURE SURGERY Right    KNEE ARTHROSCOPY Right     Prior to Admission medications   Medication Sig Start Date End Date Taking? Authorizing Provider  amLODipine (NORVASC) 5 MG tablet Take 5 mg by mouth daily.   Yes [provider]  atorvastatin (LIPITOR) 40 MG tablet Take 40 mg by mouth daily. 07/31/21  Yes [provider]  glipiZIDE (GLUCOTROL) 5 MG tablet Take 1 tablet by mouth 2 (two) times daily.   Yes [provider]  lisinopril (ZESTRIL) 10 MG tablet Take 10 mg by mouth daily.   Yes [provider]  metoprolol tartrate (LOPRESSOR) 50 MG tablet Take 50 mg by mouth 2 (two) times daily. 07/31/21  Yes [provider]  albuterol (VENTOLIN HFA) 108 (90 Base) MCG/ACT inhaler Inhale 2 puffs into the lungs every 6 (six) hours as needed. 01/10/21   [provider]  diclofenac Sodium (VOLTAREN) 1 % GEL Apply 2 g topically 4 (four) times daily. 07/10/22   [provider]  meloxicam (MOBIC) 15 MG tablet Take 1 tablet by mouth daily. Patient not taking: Reported on 07/03/2023    [provider]  meloxicam (MOBIC) 7.5 MG tablet Take 1 tablet by mouth daily as needed. Patient not taking: Reported on 07/03/2023    [provider]  Menthol (RICOLA MT) Use as directed in the mouth or throat.    [provider]    Current Outpatient Medications  Medication Sig Dispense Refill   amLODipine (NORVASC) 5 MG tablet Take 5 mg by mouth daily.     atorvastatin (LIPITOR) 40 MG tablet Take 40  mg by mouth daily.     glipiZIDE (GLUCOTROL) 5 MG tablet Take 1 tablet by mouth 2 (two) times daily.     lisinopril (ZESTRIL) 10 MG tablet Take 10 mg by mouth daily.     metoprolol tartrate (LOPRESSOR) 50 MG tablet Take 50 mg by mouth 2 (two) times daily.     albuterol (VENTOLIN HFA) 108 (90 Base) MCG/ACT inhaler Inhale 2 puffs into the lungs every 6 (six) hours as needed.     diclofenac Sodium (VOLTAREN) 1 % GEL Apply 2 g topically 4 (four) times daily.     meloxicam (MOBIC) 15 MG tablet Take 1 tablet by mouth daily. (Patient not taking: Reported on 07/03/2023)     meloxicam (MOBIC) 7.5 MG tablet Take 1 tablet by mouth daily as needed. (Patient not taking: Reported on 07/03/2023)     Menthol (RICOLA MT) Use as directed in the mouth or throat.     Current Facility-Administered Medications  Medication Dose Route Frequency Provider Last Rate Last Admin   0.9 %  sodium chloride infusion  500 mL Intravenous Once Charlanne Groom, MD        Allergies as of 07/24/2023 - Review Complete 07/24/2023  Allergen Reaction Noted   Metformin Diarrhea 02/22/2023    Family History  Problem Relation Age of Onset   Colon cancer Neg Hx  Esophageal cancer Neg Hx    Rectal cancer Neg Hx    Stomach cancer Neg Hx     Social History   Socioeconomic History   Marital status: Married    Spouse name: Not on file   Number of children: Not on file   Years of education: Not on file   Highest education level: Not on file  Occupational History   Not on file  Tobacco Use   Smoking status: Never   Smokeless tobacco: Current    Types: Snuff  Vaping Use   Vaping status: Never Used  Substance and Sexual Activity   Alcohol use: Yes    Comment: 6 a year.   Drug use: Never   Sexual activity: Not Currently  Other Topics Concern   Not on file  Social History Narrative   Not on file   Social Drivers of Health   Financial Resource Strain: Not on file  Food Insecurity: Not on file  Transportation Needs: Not  on file  Physical Activity: Not on file  Stress: Not on file  Social Connections: Not on file  Intimate Partner Violence: Not on file    Review of Systems: Positive for none All other review of systems negative except as mentioned in the HPI.  Physical Exam: Vital signs in last 24 hours: @VSRANGES @   General:   Alert,  Well-developed, well-nourished, pleasant and cooperative in NAD Lungs:  Clear throughout to auscultation.   Heart:  Regular rate and rhythm; no murmurs, clicks, rubs,  or gallops. Abdomen:  Soft, nontender and nondistended. Normal bowel sounds.   Neuro/Psych:  Alert and cooperative. Normal mood and affect. A and O x 3    No significant changes were identified.  The patient continues to be an appropriate candidate for the planned procedure and anesthesia.   Anselm Bring, MD. [facility] 07/24/2023 10:04 AM@

## 2023-07-24 NOTE — Patient Instructions (Signed)
 Handouts provided about hemorrhoids, diverticulosis, high fiber diet and polyps. High Fiber Diet. Continue present medications. Await pathology results. Repeat colonoscopy for surveillance based on pathology results.  YOU HAD AN ENDOSCOPIC PROCEDURE TODAY AT THE [facility]:   Refer to the procedure report that was given to you for any specific questions about what was found during the examination.  If the procedure report does not answer your questions, please call your gastroenterologist to clarify.  If you requested that your care partner not be given the details of your procedure findings, then the procedure report has been included in a sealed envelope for you to review at your convenience later.  YOU SHOULD EXPECT: Some feelings of bloating in the abdomen. Passage of more gas than usual.  Walking can help get rid of the air that was put into your GI tract during the procedure and reduce the bloating. If you had a lower endoscopy (such as a colonoscopy or flexible sigmoidoscopy) you may notice spotting of blood in your stool or on the toilet paper. If you underwent a bowel prep for your procedure, you may not have a normal bowel movement for a few days.  Please Note:  You might notice some irritation and congestion in your nose or some drainage.  This is from the oxygen used during your procedure.  There is no need for concern and it should clear up in a day or so.  SYMPTOMS TO REPORT IMMEDIATELY:  Following lower endoscopy (colonoscopy or flexible sigmoidoscopy):  Excessive amounts of blood in the stool  Significant tenderness or worsening of abdominal pains  Swelling of the abdomen that is new, acute  Fever of 100F or higher   For urgent or emergent issues, a gastroenterologist can be reached at any hour by calling [phone]. Do not use MyChart messaging for urgent concerns.    DIET:  We do recommend a small meal at first, but then you may proceed to your regular  diet.  Drink plenty of fluids but you should avoid alcoholic beverages for 24 hours.  ACTIVITY:  You should plan to take it easy for the rest of today and you should NOT DRIVE or use heavy machinery until tomorrow (because of the sedation medicines used during the test).    FOLLOW UP: Our staff will call the number listed on your records the next business day following your procedure.  We will call around 7:15- 8:00 am to check on you and address any questions or concerns that you may have regarding the information given to you following your procedure. If we do not reach you, we will leave a message.     If any biopsies were taken you will be contacted by phone or by letter within the next 1-3 weeks.  Please call us  at [phone] if you have not heard about the biopsies in 3 weeks.    SIGNATURES/CONFIDENTIALITY: You and/or your care partner have signed paperwork which will be entered into your electronic medical record.  These signatures attest to the fact that that the information above on your After Visit Summary has been reviewed and is understood.  Full responsibility of the confidentiality of this discharge information lies with you and/or your care-partner.

## 2023-07-24 NOTE — Progress Notes (Signed)
 Called to room to assist during endoscopic procedure.  Patient ID and intended procedure confirmed with present staff. Received instructions for my participation in the procedure from the performing physician.

## 2023-07-24 NOTE — Op Note (Signed)
 [facility] Patient Name: Terry Hoffman Procedure Date: 07/24/2023 9:53 AM MRN: [id number] Endoscopist: Lynnie Bring , MD, [id number] Age: 58 Referring MD:  Date of Birth: Aug 31, 1965 Gender: Male Account #: [id number] Procedure:                Colonoscopy Indications:              Screening for colorectal malignant neoplasm Medicines:                Monitored Anesthesia Care Procedure:                Pre-Anesthesia Assessment:                           - Prior to the procedure, a History and Physical                            was performed, and patient medications and                            allergies were reviewed. The patient's tolerance of                            previous anesthesia was also reviewed. The risks                            and benefits of the procedure and the sedation                            options and risks were discussed with the patient.                            All questions were answered, and informed consent                            was obtained. Prior Anticoagulants: The patient has                            taken no anticoagulant or antiplatelet agents. ASA                            Grade Assessment: II - A patient with mild systemic                            disease. After reviewing the risks and benefits,                            the patient was deemed in satisfactory condition to                            undergo the procedure.                           After obtaining informed consent, the colonoscope  was passed under direct vision. Throughout the                            procedure, the patient's blood pressure, pulse, and                            oxygen saturations were monitored continuously. The                            CF [id number] #[id number] was introduced through the anus                            and advanced to the 2 cm into the ileum. The                            colonoscopy was  performed without difficulty. The                            patient tolerated the procedure well. The quality                            of the bowel preparation was good. The terminal                            ileum, ileocecal valve, appendiceal orifice, and                            rectum were photographed. Scope In: 10:07:34 AM Scope Out: 10:23:26 AM Scope Withdrawal Time: 0 hours 13 minutes 33 seconds  Total Procedure Duration: 0 hours 15 minutes 52 seconds  Findings:                 Three sessile polyps were found in the mid                            ascending colon and cecum(2). The polyps were 2 to                            6 mm in size. These polyps were removed with a cold                            snare. Resection and retrieval were complete.                           A 6 mm polyp was found in the recto-sigmoid colon.                            The polyp was sessile. The polyp was removed with a                            cold snare. Resection and retrieval were complete.  A few medium-mouthed and small-mouthed diverticula                            were found in the sigmoid colon, R colon including                            cecum.                           Non-bleeding internal hemorrhoids were found during                            retroflexion. The hemorrhoids were small and Grade                            I (internal hemorrhoids that do not prolapse).                           The terminal ileum appeared normal.                           Retroflexion in the right colon was performed.                           The exam was otherwise without abnormality on                            direct and retroflexion views. Complications:            No immediate complications. Estimated Blood Loss:     Estimated blood loss: none. Impression:               - Three 2 to 6 mm polyps in the mid ascending colon                            and in the cecum,  removed with a cold snare.                            Resected and retrieved.                           - One 6 mm polyp at the recto-sigmoid colon,                            removed with a cold snare. Resected and retrieved.                           - Mild pancolonic diverticulosis.                           - Non-bleeding internal hemorrhoids.                           - The examined portion of the ileum was normal.                           -  The examination was otherwise normal on direct                            and retroflexion views. Recommendation:           - Patient has a contact number available for                            emergencies. The signs and symptoms of potential                            delayed complications were discussed with the                            patient. Return to normal activities tomorrow.                            Written discharge instructions were provided to the                            patient.                           - High fiber diet.                           - Continue present medications.                           - Await pathology results.                           - Repeat colonoscopy for surveillance based on                            pathology results.                           - The findings and recommendations were discussed                            with the patient's family. Lynnie Bring, MD 07/24/2023 10:29:27 AM This report has been signed electronically.

## 2023-07-24 NOTE — Progress Notes (Signed)
 Report given to PACU, vss

## 2023-07-25 ENCOUNTER — Telehealth: Payer: Self-pay

## 2023-07-25 NOTE — Telephone Encounter (Signed)
  Follow up Call-     07/24/2023    9:38 AM  Call back number  Post procedure Call Back phone  # [phone]  Permission to leave phone message Yes     Patient questions:  Do you have a fever, pain , or abdominal swelling? No. Pain Score  0 *  Have you tolerated food without any problems? Yes.    Have you been able to return to your normal activities? Yes.    Do you have any questions about your discharge instructions: Diet   No. Medications  No. Follow up visit  No.  Do you have questions or concerns about your Care? No.  Actions: * If pain score is 4 or above: No action needed, pain <4.

## 2023-07-29 LAB — SURGICAL PATHOLOGY

## 2023-07-31 ENCOUNTER — Ambulatory Visit: Payer: Self-pay | Admitting: Gastroenterology
